# Patient Record
Sex: Male | Born: 1965 | Hispanic: No | Marital: Married | Smoking: Never smoker
Health system: Southern US, Community
[De-identification: ages and names within clinical notes are randomized; demographics above are authoritative.]

---

## 2007-06-21 ENCOUNTER — Ambulatory Visit: Payer: Self-pay | Admitting: Internal Medicine

## 2007-06-22 ENCOUNTER — Ambulatory Visit (HOSPITAL_COMMUNITY): Admission: RE | Admit: 2007-06-22 | Discharge: 2007-06-22 | Payer: Self-pay | Admitting: Internal Medicine

## 2012-05-16 ENCOUNTER — Emergency Department (INDEPENDENT_AMBULATORY_CARE_PROVIDER_SITE_OTHER)
Admission: EM | Admit: 2012-05-16 | Discharge: 2012-05-16 | Disposition: A | Discharge status: Home / Self Care | Payer: Self-pay | Source: Home / Self Care | Attending: Emergency Medicine | Admitting: Emergency Medicine

## 2012-05-16 ENCOUNTER — Encounter (HOSPITAL_COMMUNITY): Payer: Self-pay | Admitting: Emergency Medicine

## 2012-05-16 ENCOUNTER — Emergency Department (INDEPENDENT_AMBULATORY_CARE_PROVIDER_SITE_OTHER): Payer: Self-pay

## 2012-05-16 DIAGNOSIS — G8929 Other chronic pain: Secondary | ICD-10-CM

## 2012-05-16 DIAGNOSIS — M549 Dorsalgia, unspecified: Secondary | ICD-10-CM

## 2012-05-16 LAB — POCT URINALYSIS DIP (DEVICE)
Bilirubin Urine: NEGATIVE
Leukocytes, UA: NEGATIVE
Nitrite: NEGATIVE
Protein, ur: NEGATIVE mg/dL
Urobilinogen, UA: 0.2 mg/dL (ref 0.0–1.0)
pH: 5.5 (ref 5.0–8.0)

## 2012-05-16 MED ORDER — MELOXICAM 7.5 MG PO TABS: 7.5000 mg | ORAL_TABLET | Freq: Every day | ORAL | Status: AC

## 2012-05-16 NOTE — Discharge Instructions (Signed)
As discussed you need to establish with her primary care Dr. for further evaluation. Your symptoms and exam suggest your ongoing back pain is related to muscle skeletal pain most likely related to degenerative changes in your lower back. I have recommended that you've establish a relationship with her primary care Dr. as occasionally he might need guided physical therapy or further evaluation if pain persists or worsens. In that case you need further imaging studies other exams. Also consult with an orthopedic Dr. we have provided you with 2 referrals of local clinics and 8 orthopedic provider. I have suggested you take this medicine for 2 weeks any attempt to improve your current discomfort.

## 2012-05-16 NOTE — ED Provider Notes (Signed)
History     CSN: [id number]  Arrival date & time 05/16/12  4098   First MD Initiated Contact with Patient 05/16/12 1015      Chief Complaint  Patient presents with  . Back Pain    (Consider location/radiation/quality/duration/timing/severity/associated sxs/prior treatment) HPI Comments: Patient presents urgent care complaining of ongoing back pain for about a year it's been progressively worse within the last 3 months. He used to be a patient of [facility] and used to have hip and back problems as he describes had some x-rays in 2008. He describes the pain is typically more common or obvious at night. Patient denies any other associated symptoms such as, numbness, tingling, lower extremity weakness, unintentional weight loss, fevers, or abdominal pains.  Patient describes the pain as dull and no other symptoms. Patient denies any urinary symptoms.  Patient is a 46 y.o. male presenting with back pain. The history is provided by the patient.  Back Pain  This is a new problem. The problem occurs constantly. The problem has not changed since onset.The pain is present in the lumbar spine. The quality of the pain is described as aching. The pain is at a severity of 6/10. The pain is moderate. The pain is worse during the day. Pertinent negatives include no chest pain, no fever, no numbness, no weight loss, no headaches, no abdominal swelling, no bowel incontinence, no bladder incontinence, no dysuria, no pelvic pain, no leg pain, no paresthesias, no paresis, no tingling and no weakness. He has tried nothing for the symptoms.    History reviewed. No pertinent past medical history.  History reviewed. No pertinent past surgical history.  History reviewed. No pertinent family history.  History  Substance Use Topics  . Smoking status: Not on file  . Smokeless tobacco: Not on file  . Alcohol Use: Not on file      Review of Systems  Constitutional: Negative for fever, chills, weight loss,  diaphoresis, activity change, appetite change and fatigue.  Respiratory: Negative for shortness of breath.   Cardiovascular: Negative for chest pain.  Gastrointestinal: Negative for bowel incontinence.  Genitourinary: Negative for bladder incontinence, dysuria, frequency, flank pain and pelvic pain.  Musculoskeletal: Positive for back pain. Negative for myalgias, joint swelling and gait problem.  Skin: Negative for rash.  Neurological: Negative for tingling, weakness, numbness, headaches and paresthesias.    Allergies  Review of patient's allergies indicates no known allergies.  Home Medications   Current Outpatient Rx  Name Route Sig Dispense Refill  . MELOXICAM 7.5 MG PO TABS Oral Take 1 tablet (7.5 mg total) by mouth daily. 14 tablet 0    BP 127/79  Pulse 66  Temp(Src) 98.5 F (36.9 C) (Oral)  Resp 16  SpO2 100%  Physical Exam  Nursing note and vitals reviewed. Constitutional: He appears well-developed and well-nourished.  HENT:  Head: Normocephalic.  Eyes: Conjunctivae are normal.  Neck: Neck supple. No JVD present.  Cardiovascular: Normal rate.   No murmur heard. Pulmonary/Chest: Effort normal.  Abdominal: Soft.  Musculoskeletal: He exhibits no tenderness.       Lumbar back: He exhibits tenderness, bony tenderness and pain. He exhibits normal range of motion, no swelling, no deformity, no laceration and normal pulse.       Back:  Lymphadenopathy:    He has no cervical adenopathy.  Neurological: He is alert.  Skin: Skin is warm. No rash noted. No erythema.    ED Course  Procedures (including critical care time)   Labs  Reviewed  POCT URINALYSIS DIP (DEVICE)   Dg Lumbar Spine Complete  05/16/2012  *RADIOLOGY REPORT*  Clinical Data: Back pain.  LUMBAR SPINE - COMPLETE 4+ VIEW  Comparison: 06/22/2007.  Findings: Stable transitional lumbar anatomy with partial lumbarization of S1.  The lateral film demonstrates normal alignment.  Vertebral bodies and disc spaces  are maintained.  No acute bony findings.  Normal alignment of the facet joints and no pars defects.  The visualized bony pelvis in intact.  IMPRESSION: Normal alignment and no acute bony findings.  No change since prior study.  Original Report Authenticated By: P. Loralie Champagne, M.D.     1. Chronic back pain greater than 3 months duration       MDM  Patient has chronic back pain on chart review did not find a lumbar spine x-ray did find a hip x-ray to noting degenerative changes on his back. Patient has no constitutional symptoms or abdominal pain to make me suspect of any other secondary reason for his back pain other than musculoskeletal but have recommended he establishes himself again with a primary care Dr. to return to [facility] for followup in case discomfort persist.        Jimmie Molly, MD 05/16/12 1213

## 2012-05-16 NOTE — ED Notes (Signed)
C/o of lower back pain x 1 year or longer; increasing last month espec while sleeping

## 2016-10-18 ENCOUNTER — Encounter (HOSPITAL_COMMUNITY): Payer: Self-pay

## 2016-10-18 ENCOUNTER — Emergency Department (HOSPITAL_COMMUNITY)
Admission: EM | Admit: 2016-10-18 | Discharge: 2016-10-19 | Disposition: A | Discharge status: Home / Self Care | Payer: No Typology Code available for payment source | Attending: Emergency Medicine | Admitting: Emergency Medicine

## 2016-10-18 DIAGNOSIS — Y9389 Activity, other specified: Secondary | ICD-10-CM | POA: Insufficient documentation

## 2016-10-18 DIAGNOSIS — S0280XA Fracture of other specified skull and facial bones, unspecified side, initial encounter for closed fracture: Secondary | ICD-10-CM | POA: Insufficient documentation

## 2016-10-18 DIAGNOSIS — S0285XA Fracture of orbit, unspecified, initial encounter for closed fracture: Secondary | ICD-10-CM

## 2016-10-18 DIAGNOSIS — S0990XA Unspecified injury of head, initial encounter: Secondary | ICD-10-CM | POA: Diagnosis present

## 2016-10-18 DIAGNOSIS — Y999 Unspecified external cause status: Secondary | ICD-10-CM | POA: Diagnosis not present

## 2016-10-18 DIAGNOSIS — Y929 Unspecified place or not applicable: Secondary | ICD-10-CM | POA: Diagnosis not present

## 2016-10-18 DIAGNOSIS — S022XXA Fracture of nasal bones, initial encounter for closed fracture: Secondary | ICD-10-CM | POA: Insufficient documentation

## 2016-10-18 NOTE — ED Triage Notes (Signed)
Patient also has a hematoma under his right eye.  Patient is alert and oriented x4.
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
## 2016-10-19 ENCOUNTER — Emergency Department (HOSPITAL_COMMUNITY): Payer: No Typology Code available for payment source

## 2016-10-19 MED ORDER — HYDROCODONE-ACETAMINOPHEN 5-325 MG PO TABS: 1.0000 | ORAL_TABLET | ORAL | 0 refills | Status: AC | PRN

## 2016-10-19 MED ORDER — HYDROCODONE-ACETAMINOPHEN 5-325 MG PO TABS
1.0000 | ORAL_TABLET | Freq: Once | ORAL | Status: AC
  Administered 2016-10-19: 1 via ORAL
  Filled 2016-10-19: qty 1
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
## 2016-10-19 NOTE — ED Notes (Signed)
Pt in CT.

## 2016-10-19 NOTE — ED Notes (Signed)
MD at bedside. 
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
## 2018-07-08 LAB — GLUCOSE, POCT (MANUAL RESULT ENTRY): POC Glucose: 131 mg/dl — AB (ref 70–99)

## 2022-11-27 DIAGNOSIS — Z419 Encounter for procedure for purposes other than remedying health state, unspecified: Secondary | ICD-10-CM | POA: Diagnosis not present

## 2022-12-31 DIAGNOSIS — Z113 Encounter for screening for infections with a predominantly sexual mode of transmission: Secondary | ICD-10-CM | POA: Diagnosis not present

## 2022-12-31 DIAGNOSIS — Z1329 Encounter for screening for other suspected endocrine disorder: Secondary | ICD-10-CM | POA: Diagnosis not present

## 2022-12-31 DIAGNOSIS — Z23 Encounter for immunization: Secondary | ICD-10-CM | POA: Diagnosis not present

## 2022-12-31 DIAGNOSIS — Z1211 Encounter for screening for malignant neoplasm of colon: Secondary | ICD-10-CM | POA: Diagnosis not present

## 2022-12-31 DIAGNOSIS — Z130 Encounter for screening for diseases of the blood and blood-forming organs and certain disorders involving the immune mechanism: Secondary | ICD-10-CM | POA: Diagnosis not present

## 2022-12-31 DIAGNOSIS — R7302 Impaired glucose tolerance (oral): Secondary | ICD-10-CM | POA: Diagnosis not present

## 2022-12-31 DIAGNOSIS — Z13220 Encounter for screening for lipoid disorders: Secondary | ICD-10-CM | POA: Diagnosis not present

## 2022-12-31 DIAGNOSIS — Z0000 Encounter for general adult medical examination without abnormal findings: Secondary | ICD-10-CM | POA: Diagnosis not present

## 2022-12-31 DIAGNOSIS — Z1159 Encounter for screening for other viral diseases: Secondary | ICD-10-CM | POA: Diagnosis not present

## 2022-12-31 DIAGNOSIS — Z114 Encounter for screening for human immunodeficiency virus [HIV]: Secondary | ICD-10-CM | POA: Diagnosis not present

## 2023-01-13 DIAGNOSIS — Z1211 Encounter for screening for malignant neoplasm of colon: Secondary | ICD-10-CM | POA: Diagnosis not present

## 2023-01-28 DIAGNOSIS — Z419 Encounter for procedure for purposes other than remedying health state, unspecified: Secondary | ICD-10-CM | POA: Diagnosis not present

## 2023-02-15 DIAGNOSIS — Z1211 Encounter for screening for malignant neoplasm of colon: Secondary | ICD-10-CM | POA: Diagnosis not present

## 2023-02-26 DIAGNOSIS — Z419 Encounter for procedure for purposes other than remedying health state, unspecified: Secondary | ICD-10-CM | POA: Diagnosis not present

## 2023-03-29 DIAGNOSIS — Z419 Encounter for procedure for purposes other than remedying health state, unspecified: Secondary | ICD-10-CM | POA: Diagnosis not present

## 2023-04-28 DIAGNOSIS — Z419 Encounter for procedure for purposes other than remedying health state, unspecified: Secondary | ICD-10-CM | POA: Diagnosis not present

## 2023-05-29 DIAGNOSIS — Z419 Encounter for procedure for purposes other than remedying health state, unspecified: Secondary | ICD-10-CM | POA: Diagnosis not present

## 2023-06-28 DIAGNOSIS — Z419 Encounter for procedure for purposes other than remedying health state, unspecified: Secondary | ICD-10-CM | POA: Diagnosis not present

## 2023-07-29 DIAGNOSIS — Z419 Encounter for procedure for purposes other than remedying health state, unspecified: Secondary | ICD-10-CM | POA: Diagnosis not present

## 2023-08-29 DIAGNOSIS — Z419 Encounter for procedure for purposes other than remedying health state, unspecified: Secondary | ICD-10-CM | POA: Diagnosis not present

## 2023-09-28 DIAGNOSIS — Z419 Encounter for procedure for purposes other than remedying health state, unspecified: Secondary | ICD-10-CM | POA: Diagnosis not present

## 2023-10-20 ENCOUNTER — Ambulatory Visit: Payer: Medicaid Other

## 2023-10-20 DIAGNOSIS — Z23 Encounter for immunization: Secondary | ICD-10-CM

## 2023-10-29 DIAGNOSIS — Z419 Encounter for procedure for purposes other than remedying health state, unspecified: Secondary | ICD-10-CM | POA: Diagnosis not present

## 2023-11-28 DIAGNOSIS — Z419 Encounter for procedure for purposes other than remedying health state, unspecified: Secondary | ICD-10-CM | POA: Diagnosis not present

## 2023-12-29 DIAGNOSIS — Z419 Encounter for procedure for purposes other than remedying health state, unspecified: Secondary | ICD-10-CM | POA: Diagnosis not present

## 2024-01-29 DIAGNOSIS — Z419 Encounter for procedure for purposes other than remedying health state, unspecified: Secondary | ICD-10-CM | POA: Diagnosis not present

## 2024-02-26 DIAGNOSIS — Z419 Encounter for procedure for purposes other than remedying health state, unspecified: Secondary | ICD-10-CM | POA: Diagnosis not present

## 2024-04-08 DIAGNOSIS — Z419 Encounter for procedure for purposes other than remedying health state, unspecified: Secondary | ICD-10-CM | POA: Diagnosis not present

## 2024-05-08 DIAGNOSIS — Z419 Encounter for procedure for purposes other than remedying health state, unspecified: Secondary | ICD-10-CM | POA: Diagnosis not present

## 2024-06-08 DIAGNOSIS — Z419 Encounter for procedure for purposes other than remedying health state, unspecified: Secondary | ICD-10-CM | POA: Diagnosis not present

## 2024-07-08 DIAGNOSIS — Z419 Encounter for procedure for purposes other than remedying health state, unspecified: Secondary | ICD-10-CM | POA: Diagnosis not present

## 2024-08-08 DIAGNOSIS — Z419 Encounter for procedure for purposes other than remedying health state, unspecified: Secondary | ICD-10-CM | POA: Diagnosis not present

## 2024-09-08 DIAGNOSIS — Z419 Encounter for procedure for purposes other than remedying health state, unspecified: Secondary | ICD-10-CM | POA: Diagnosis not present

## 2024-11-08 DIAGNOSIS — Z419 Encounter for procedure for purposes other than remedying health state, unspecified: Secondary | ICD-10-CM | POA: Diagnosis not present

## 2024-12-14 DIAGNOSIS — R7302 Impaired glucose tolerance (oral): Secondary | ICD-10-CM | POA: Diagnosis not present

## 2024-12-14 DIAGNOSIS — Z23 Encounter for immunization: Secondary | ICD-10-CM | POA: Diagnosis not present

## 2024-12-14 DIAGNOSIS — Z0000 Encounter for general adult medical examination without abnormal findings: Secondary | ICD-10-CM | POA: Diagnosis not present

## 2024-12-14 DIAGNOSIS — E559 Vitamin D deficiency, unspecified: Secondary | ICD-10-CM | POA: Diagnosis not present

## 2024-12-14 DIAGNOSIS — Z13220 Encounter for screening for lipoid disorders: Secondary | ICD-10-CM | POA: Diagnosis not present

## 2024-12-14 DIAGNOSIS — Z1329 Encounter for screening for other suspected endocrine disorder: Secondary | ICD-10-CM | POA: Diagnosis not present

## 2024-12-14 DIAGNOSIS — Z139 Encounter for screening, unspecified: Secondary | ICD-10-CM | POA: Diagnosis not present

## 2024-12-14 DIAGNOSIS — Z125 Encounter for screening for malignant neoplasm of prostate: Secondary | ICD-10-CM | POA: Diagnosis not present
